# Patient Record
Sex: Male | Born: 1935 | Race: White | Hispanic: No | Marital: Married
Health system: Southern US, Community
[De-identification: ages and names within clinical notes are randomized; demographics above are authoritative.]

---

## 2006-12-18 ENCOUNTER — Emergency Department: Payer: Self-pay | Admitting: Emergency Medicine

## 2006-12-28 ENCOUNTER — Emergency Department: Payer: Self-pay | Admitting: Internal Medicine

## 2010-06-06 ENCOUNTER — Inpatient Hospital Stay: Payer: Self-pay | Admitting: Internal Medicine

## 2011-07-03 ENCOUNTER — Inpatient Hospital Stay: Payer: Self-pay | Admitting: Internal Medicine

## 2011-07-03 LAB — CBC WITH DIFFERENTIAL/PLATELET
Eosinophil #: 0.2 10*3/uL (ref 0.0–0.7)
Eosinophil %: 1.4 %
HGB: 12.9 g/dL — ABNORMAL LOW (ref 13.0–18.0)
Lymphocyte #: 1.9 10*3/uL (ref 1.0–3.6)
Lymphocyte %: 12.6 %
MCH: 31.2 pg (ref 26.0–34.0)
MCV: 94 fL (ref 80–100)
Monocyte %: 7.3 %
Neutrophil %: 78.4 %
Platelet: 233 10*3/uL (ref 150–440)
RBC: 4.13 10*6/uL — ABNORMAL LOW (ref 4.40–5.90)
WBC: 15.1 10*3/uL — ABNORMAL HIGH (ref 3.8–10.6)

## 2011-07-03 LAB — COMPREHENSIVE METABOLIC PANEL
Albumin: 3.3 g/dL — ABNORMAL LOW (ref 3.4–5.0)
Anion Gap: 13 (ref 7–16)
BUN: 39 mg/dL — ABNORMAL HIGH (ref 7–18)
Chloride: 109 mmol/L — ABNORMAL HIGH (ref 98–107)
Co2: 20 mmol/L — ABNORMAL LOW (ref 21–32)
EGFR (African American): >60
Glucose: 137 mg/dL — ABNORMAL HIGH (ref 65–99)
Osmolality: 295 (ref 275–301)
Potassium: 5.4 mmol/L — ABNORMAL HIGH (ref 3.5–5.1)
SGOT(AST): 30 U/L (ref 15–37)
SGPT (ALT): 24 U/L
Sodium: 142 mmol/L (ref 136–145)
Total Protein: 8.1 g/dL (ref 6.4–8.2)

## 2011-07-03 LAB — URINALYSIS, COMPLETE
Bilirubin,UR: NEGATIVE
Ketone: NEGATIVE
Leukocyte Esterase: NEGATIVE
Nitrite: NEGATIVE
Protein: NEGATIVE
RBC,UR: 1 /HPF (ref 0–5)
WBC UR: <1 /HPF (ref 0–5)

## 2011-07-03 LAB — CK-MB: CK-MB: 2.4 ng/mL (ref 0.5–3.6)

## 2011-07-03 LAB — TROPONIN I
Troponin-I: <0.02 ng/mL
Troponin-I: <0.02 ng/mL

## 2011-07-03 LAB — MAGNESIUM: Magnesium: 1.9 mg/dL

## 2011-07-03 LAB — PRO B NATRIURETIC PEPTIDE: B-Type Natriuretic Peptide: 2597 pg/mL — ABNORMAL HIGH (ref 0–450)

## 2011-07-03 LAB — TSH: Thyroid Stimulating Horm: 2.45 u[IU]/mL

## 2011-07-04 LAB — CK-MB: CK-MB: 1.5 ng/mL (ref 0.5–3.6)

## 2011-07-04 LAB — CBC WITH DIFFERENTIAL/PLATELET
Comment - H1-Com3: NORMAL
HGB: 10.8 g/dL — ABNORMAL LOW (ref 13.0–18.0)
MCH: 31 pg (ref 26.0–34.0)
MCV: 93 fL (ref 80–100)
Monocytes: 11 %
Platelet: 184 10*3/uL (ref 150–440)
RDW: 15.3 % — ABNORMAL HIGH (ref 11.5–14.5)
Variant Lymphocyte - H1-Rlymph: 8 %
WBC: 12 10*3/uL — ABNORMAL HIGH (ref 3.8–10.6)

## 2011-07-04 LAB — BASIC METABOLIC PANEL
Anion Gap: 11 (ref 7–16)
Calcium, Total: 8.2 mg/dL — ABNORMAL LOW (ref 8.5–10.1)
Creatinine: 0.76 mg/dL (ref 0.60–1.30)
EGFR (African American): >60
EGFR (Non-African Amer.): >60
Glucose: 122 mg/dL — ABNORMAL HIGH (ref 65–99)
Sodium: 145 mmol/L (ref 136–145)

## 2011-07-04 LAB — PROTIME-INR
INR: 1.3
Prothrombin Time: 16.4 secs — ABNORMAL HIGH (ref 11.5–14.7)

## 2011-07-05 LAB — CBC WITH DIFFERENTIAL/PLATELET
Basophil #: 0 10*3/uL (ref 0.0–0.1)
Basophil %: 0.3 %
Eosinophil #: 0.1 10*3/uL (ref 0.0–0.7)
Lymphocyte %: 13.5 %
MCHC: 33.3 g/dL (ref 32.0–36.0)
Monocyte #: 2.7 10*3/uL — ABNORMAL HIGH (ref 0.0–0.7)
Monocyte %: 17.6 %
Neutrophil #: 10.3 10*3/uL — ABNORMAL HIGH (ref 1.4–6.5)
Platelet: 173 10*3/uL (ref 150–440)
RDW: 15.5 % — ABNORMAL HIGH (ref 11.5–14.5)
WBC: 15.2 10*3/uL — ABNORMAL HIGH (ref 3.8–10.6)

## 2011-07-05 LAB — BASIC METABOLIC PANEL
BUN: 13 mg/dL (ref 7–18)
Calcium, Total: 7.7 mg/dL — ABNORMAL LOW (ref 8.5–10.1)
Chloride: 110 mmol/L — ABNORMAL HIGH (ref 98–107)
Co2: 25 mmol/L (ref 21–32)
Creatinine: 0.71 mg/dL (ref 0.60–1.30)
EGFR (African American): >60
EGFR (Non-African Amer.): >60
Glucose: 112 mg/dL — ABNORMAL HIGH (ref 65–99)
Osmolality: 290 (ref 275–301)
Potassium: 3.9 mmol/L (ref 3.5–5.1)
Sodium: 145 mmol/L (ref 136–145)

## 2011-07-06 LAB — PLATELET COUNT: Platelet: 165 10*3/uL (ref 150–440)

## 2011-07-06 LAB — BASIC METABOLIC PANEL
BUN: 11 mg/dL (ref 7–18)
Calcium, Total: 7.3 mg/dL — ABNORMAL LOW (ref 8.5–10.1)
Chloride: 109 mmol/L — ABNORMAL HIGH (ref 98–107)
Co2: 23 mmol/L (ref 21–32)
Osmolality: 282 (ref 275–301)
Potassium: 3.6 mmol/L (ref 3.5–5.1)
Sodium: 141 mmol/L (ref 136–145)

## 2011-07-06 LAB — HEMOGLOBIN: HGB: 7.2 g/dL — ABNORMAL LOW (ref 13.0–18.0)

## 2011-07-07 ENCOUNTER — Ambulatory Visit: Payer: Self-pay | Admitting: Family Medicine

## 2011-07-07 LAB — URINALYSIS, COMPLETE
Bacteria: NONE SEEN
Glucose,UR: 150 mg/dL (ref 0–75)
Nitrite: NEGATIVE
Protein: NEGATIVE
WBC UR: 2 /HPF (ref 0–5)

## 2011-07-07 LAB — HEMOGLOBIN: HGB: 8.3 g/dL — ABNORMAL LOW (ref 13.0–18.0)

## 2011-07-09 LAB — URINE CULTURE

## 2011-07-13 LAB — CULTURE, BLOOD (SINGLE)

## 2011-10-10 DEATH — deceased

## 2012-12-12 IMAGING — CT CT CERVICAL SPINE WITHOUT CONTRAST
1 series · 12 of 14 positions shown, 15 images · non-contrast
Comparison: No prior.

REASON FOR EXAM: fell hit head
COMMENTS:

PROCEDURE:     CT  - CT CERVICAL SPINE WO  - July 03, 2011  [DATE]
RESULT:
HISTORY: Fall.

[Series 5: axial · axial · 0.33mm/px · z∈[+31,+174]mm · 12 of 95 slices shown, 15 images]
[im 8/95  soft-tissue]
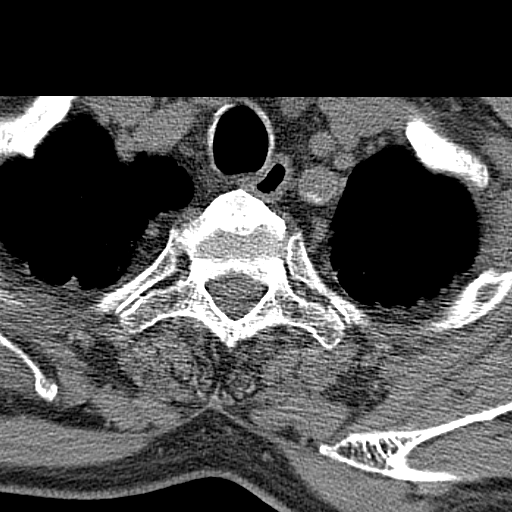
[im 8/95  bone]
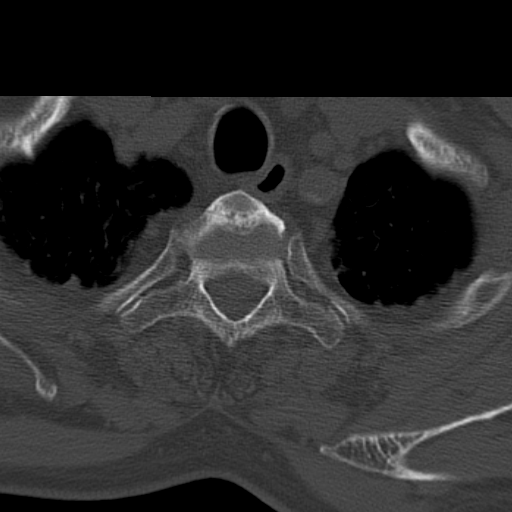
[im 15/95  bone]
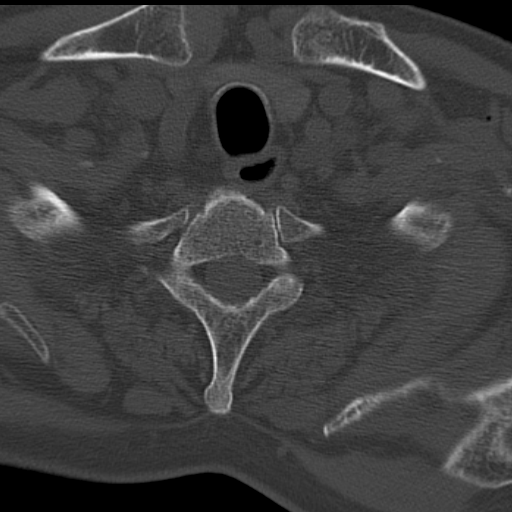
[im 22/95  bone]
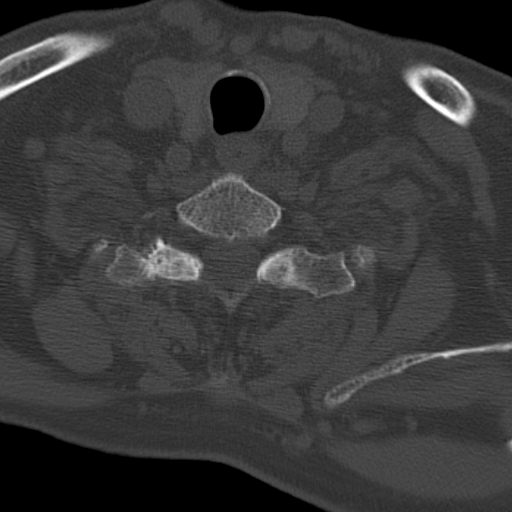
[im 29/95  bone]
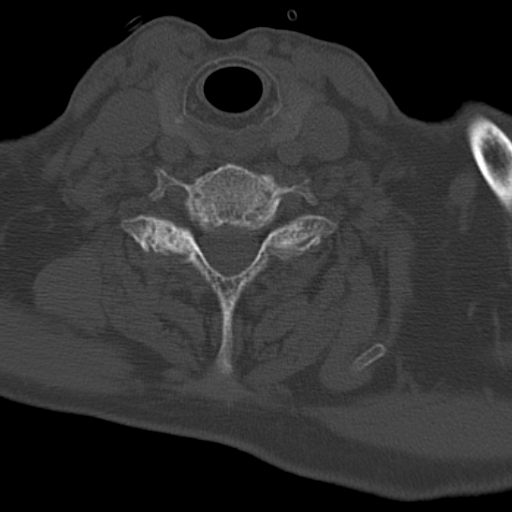
[im 37/95  soft-tissue]
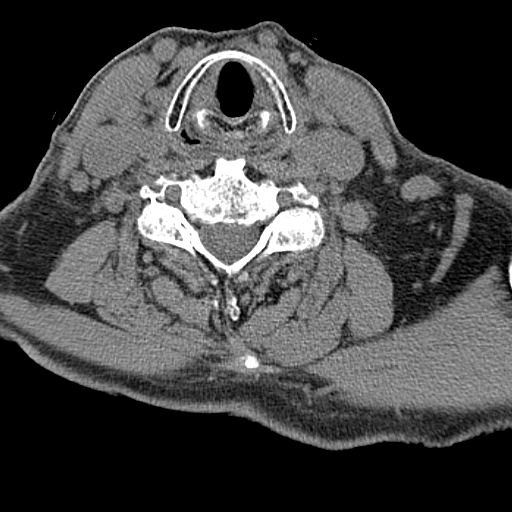
[im 37/95  bone]
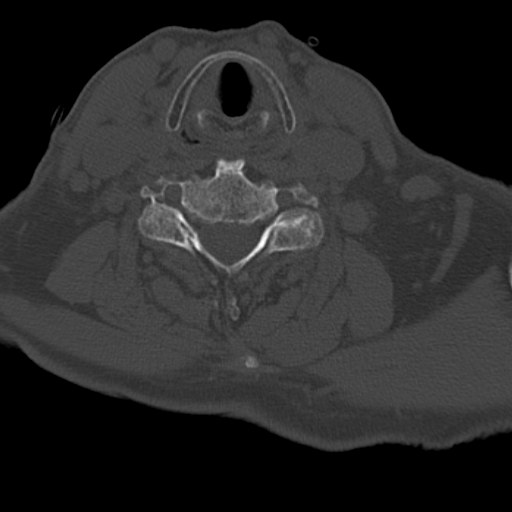
[im 44/95  bone]
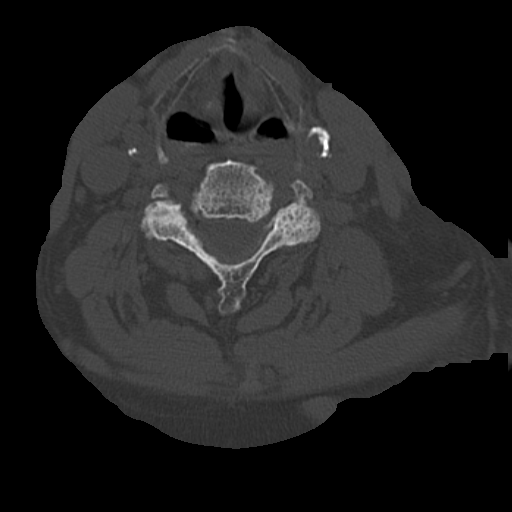
[im 51/95  bone]
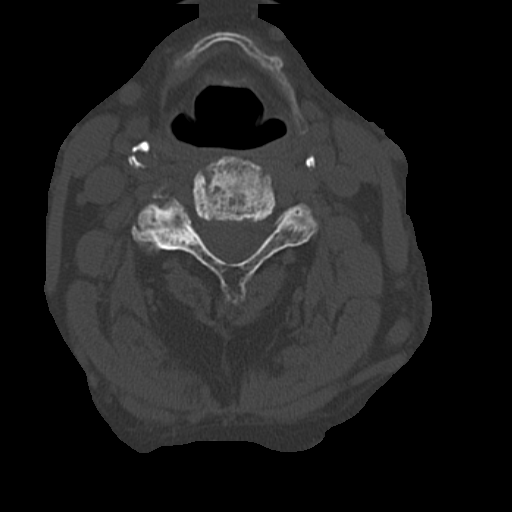
[im 58/95  bone]
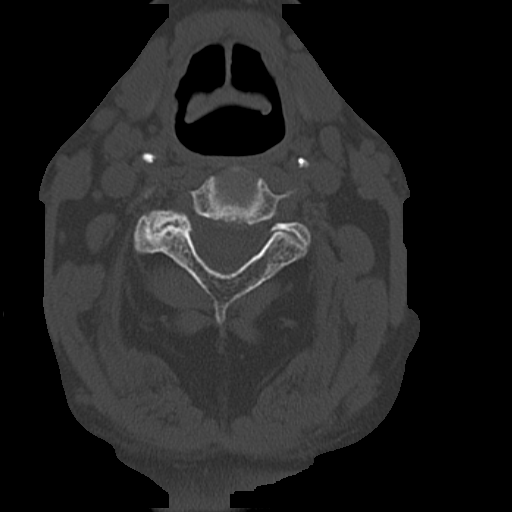
[im 66/95  soft-tissue]
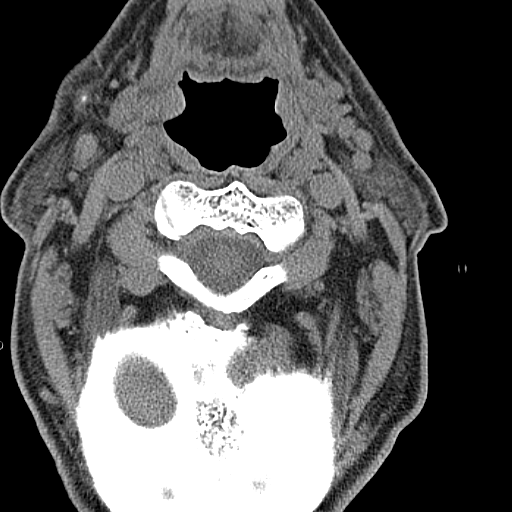
[im 66/95  bone]
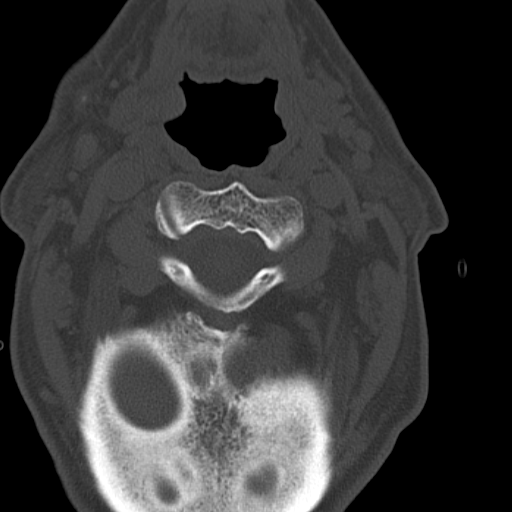
[im 73/95  bone]
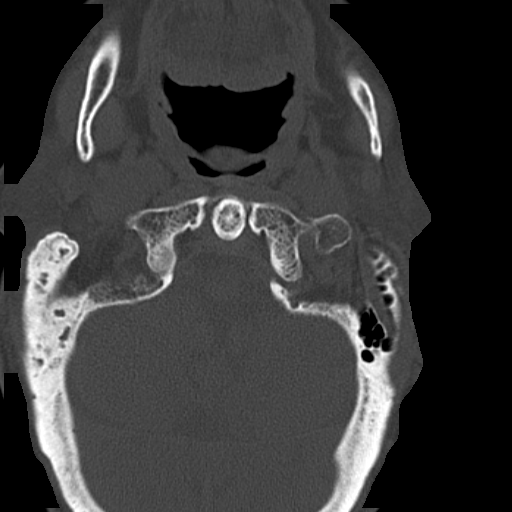
[im 80/95  bone]
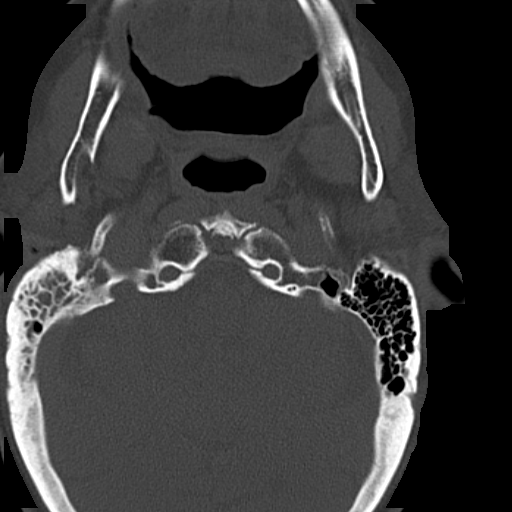
[im 87/95  bone]
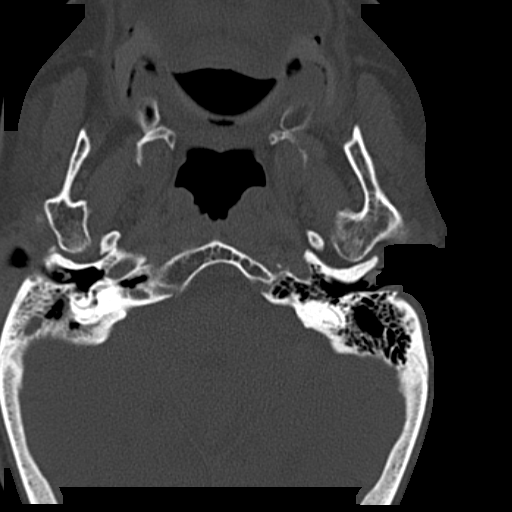

[12 of 14 positions shown; findings below may reference images not displayed]

PROCEDURE AND FINDINGS:  Standard CT of the cervical spine is obtained. No
evidence of fracture. Diffuse degenerative change is present. Congenital
fusion of C4-5 is present. Vascular calcification is present. Small
submandibular and anterior cervical lymph nodes are noted. Apical pleural
parenchymal thickening is present.
IMPRESSION: 1.  Diffuse degenerative change, no acute abnormality.
2.  Multiple submandibular and anterior cervical small lymph nodes.
3.  Pleural parenchymal scarring lung apices.
4.  Bilateral carotid prominent vascular calcification indicating
atherosclerotic vascular disease.

## 2012-12-12 IMAGING — CT CT HEAD WITHOUT CONTRAST
2 series · 15 of 30 positions shown, 19 images · non-contrast
Comparison: none

REASON FOR EXAM: fell hit head
COMMENTS:

[Series 2: without · axial · non-contrast · 0.46mm/px · z∈[+179,+324]mm · 13 of 35 slices shown, 17 images]
[im 3/35  brain]
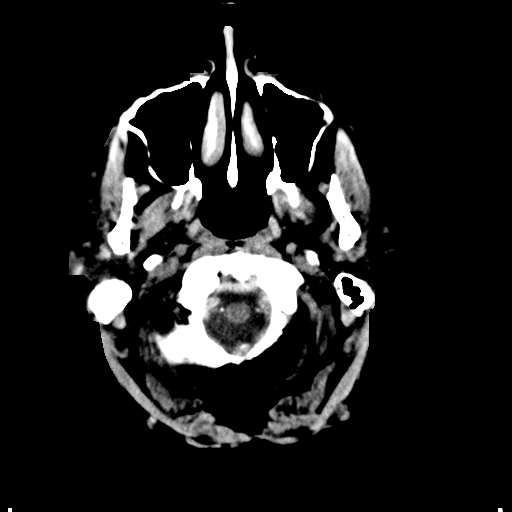
[im 3/35  bone]
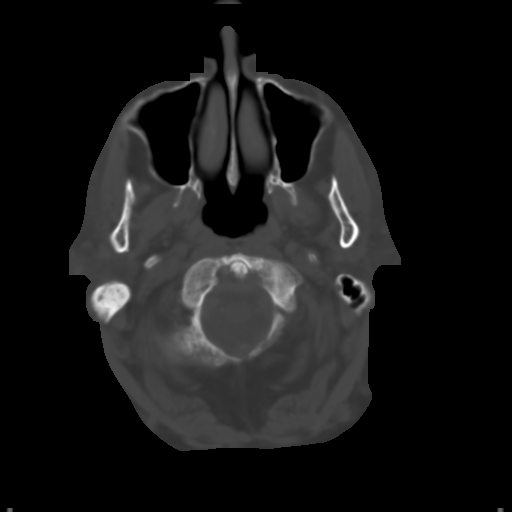
[im 5/35  brain]
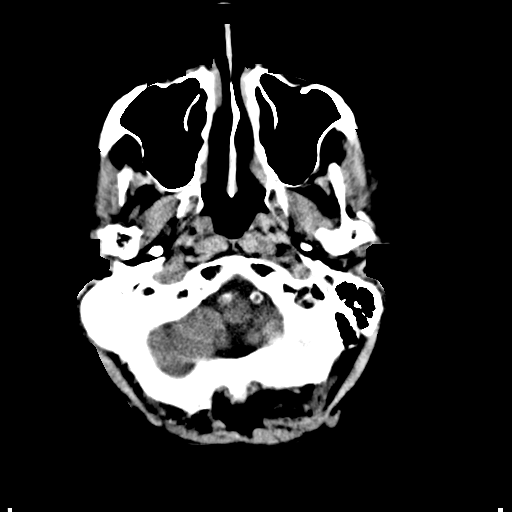
[im 8/35  brain]
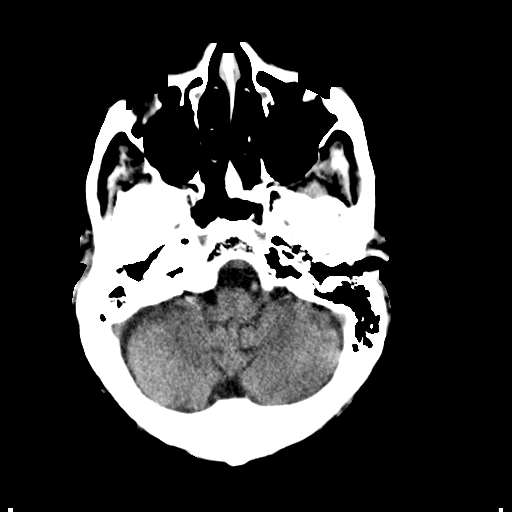
[im 10/35  brain]
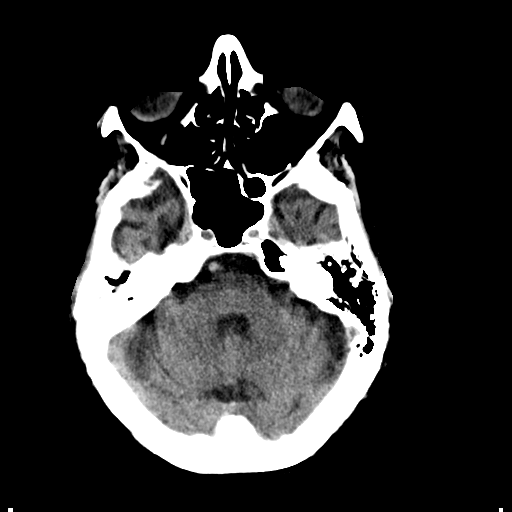
[im 13/35  brain]
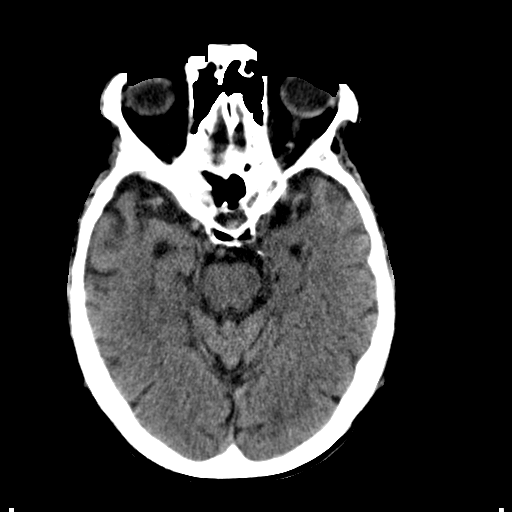
[im 13/35  bone]
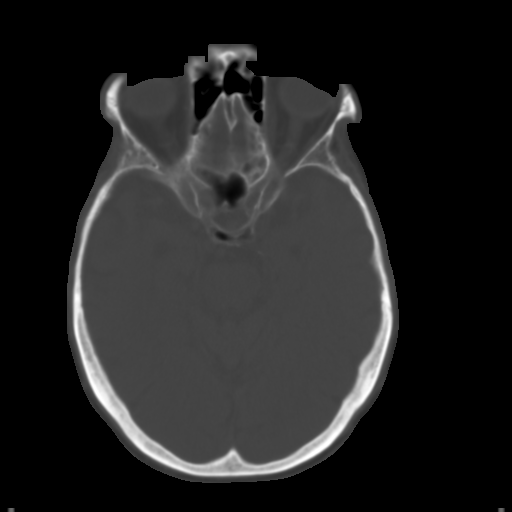
[im 15/35  brain]
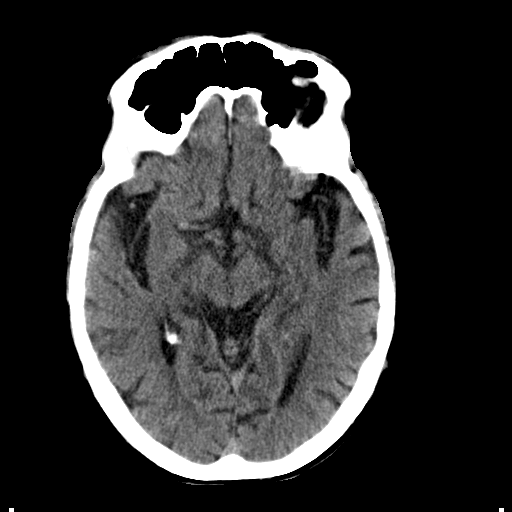
[im 18/35  brain]
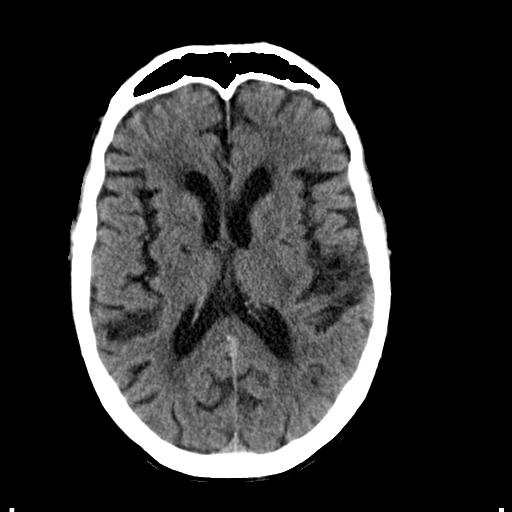
[im 20/35  brain]
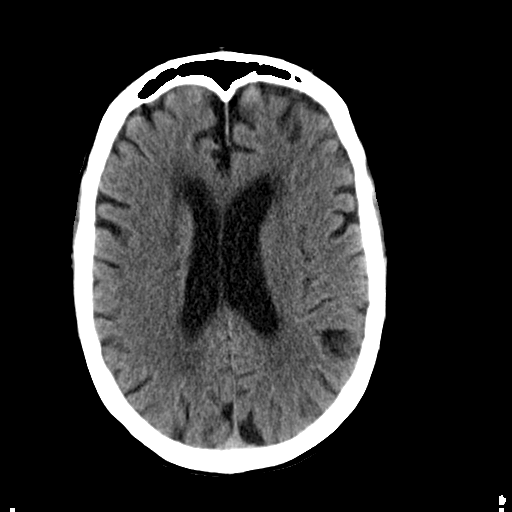
[im 22/35  brain]
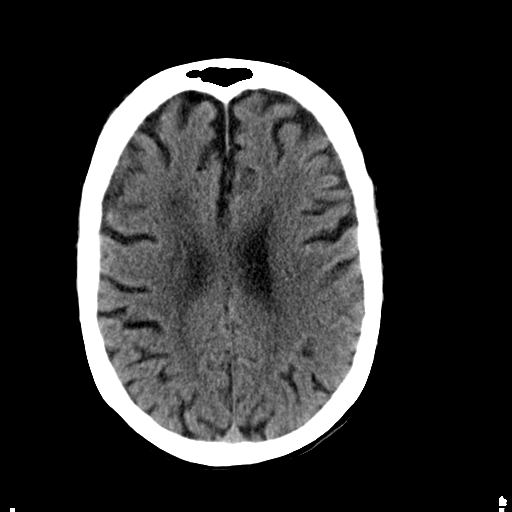
[im 22/35  bone]
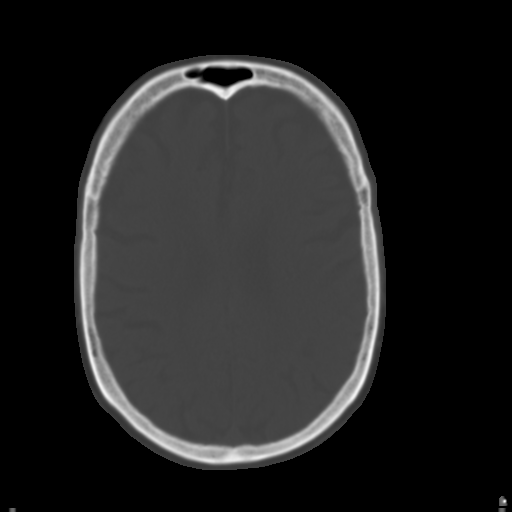
[im 25/35  brain]
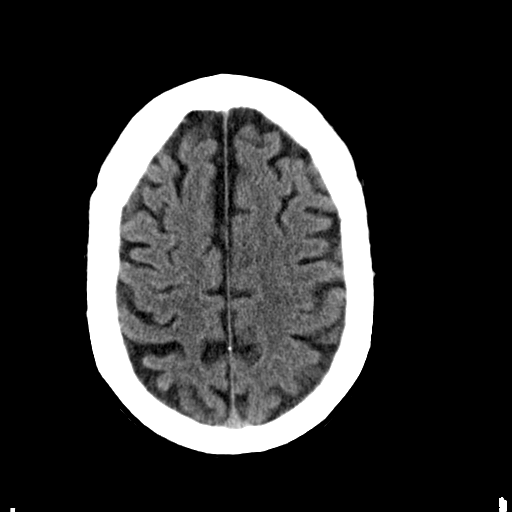
[im 27/35  brain]
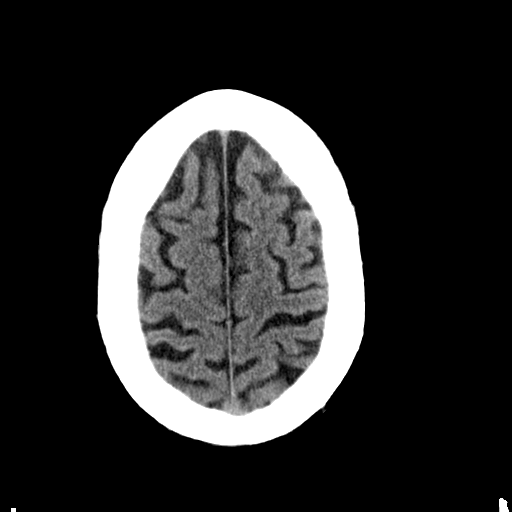
[im 30/35  brain]
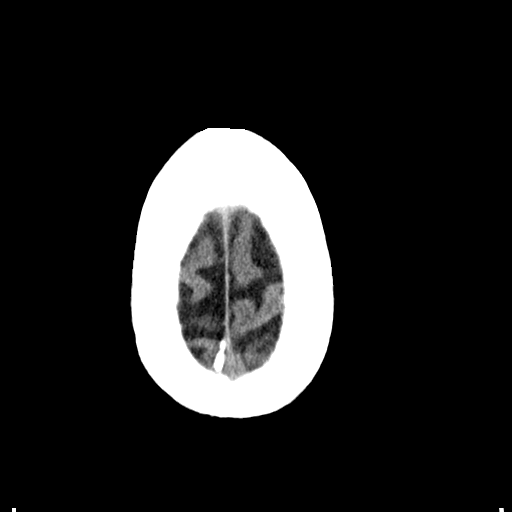
[im 32/35  brain]
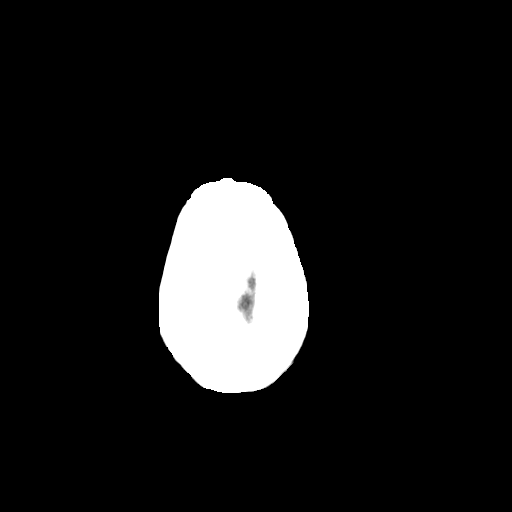
[im 32/35  bone]
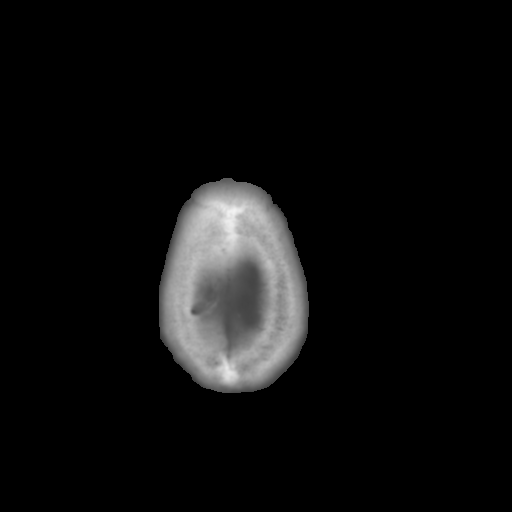

[Series 3: bone · axial · 0.46mm/px · z∈[+179,+204]mm · 2 of 35 slices shown]
[im 3/35  bone]
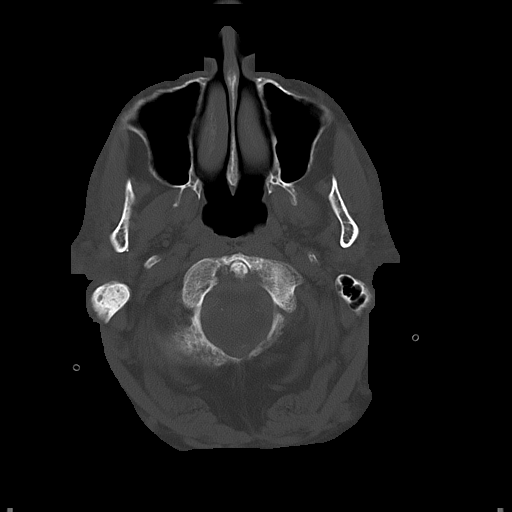
[im 8/35  bone]
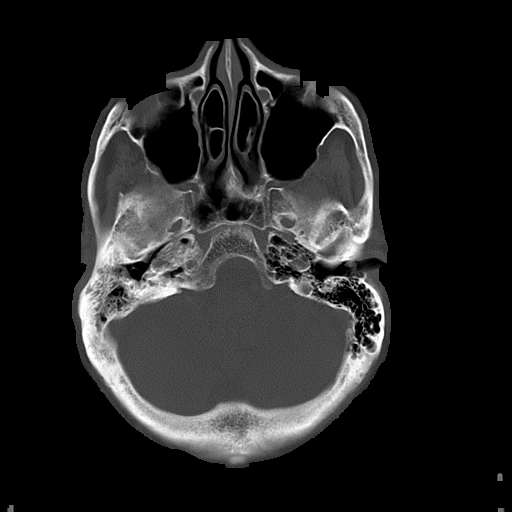

[15 of 30 positions shown; findings below may reference images not displayed]

PROCEDURE:     CT  - CT HEAD WITHOUT CONTRAST  - July 03, 2011  [DATE]

RESULT:     Axial noncontrast CT scanning was performed through the brain
with reconstructions at 5 mm intervals and slice thicknesses. Comparison is
made to the study 07 June, 2010.

There is mild age-appropriate diffuse cerebral and cerebellar atrophy with
very mild compensatory ventriculomegaly. There is decreased density in the
deep white matter of both cerebral hemispheres consistent with chronic small
vessel ischemic type change. Old subcentimeter lacunar infarctions are
demonstrated in the basal ganglia bilaterally. There is no intracranial
hemorrhage nor evidence of an acute evolving ischemic infarction. I see no
abnormal intracranial calcifications. The cerebellum and brainstem exhibit
no acute abnormality.

At bone window settings the observed portions of the paranasal sinuses and
left mastoid air cells are clear. There is chronic increased density within
the right mastoid bone. There is no evidence of an acute skull fracture.
IMPRESSION: I see no acute intracranial abnormality.

A preliminary report was sent to the [HOSPITAL] the conclusion
of the study.

## 2012-12-13 IMAGING — CR DG FEMUR 2V*R*
1 series · 4 of 4 positions shown · non-contrast
Comparison: none

REASON FOR EXAM: postop
COMMENTS:

[Series 1: ap · 0.17mm/px · 4 of 4 slices shown]
[im 1/4]
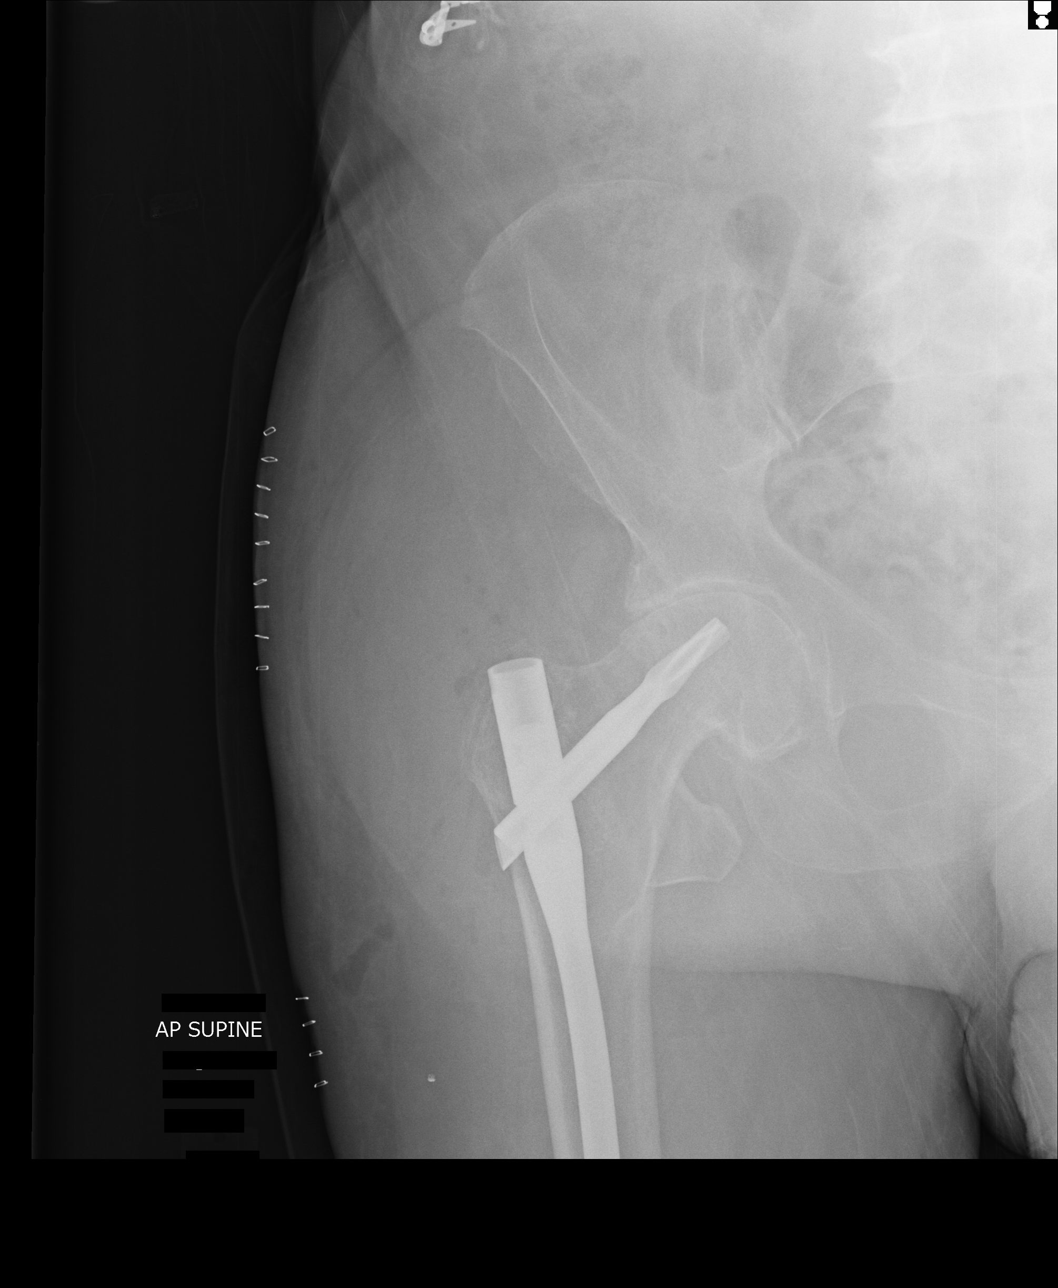
[im 2/4]
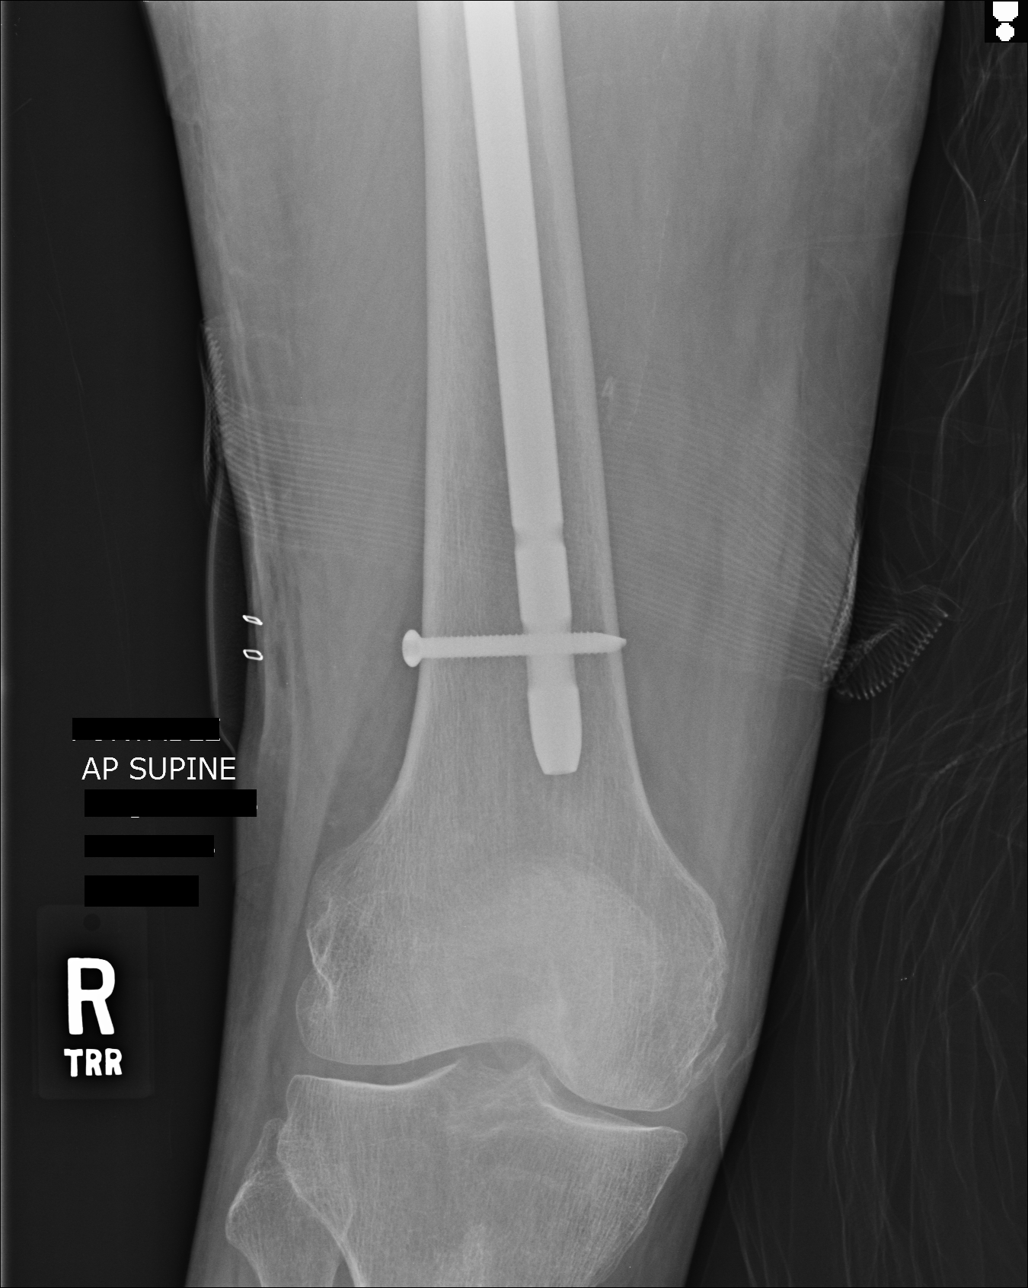
[im 3/4]
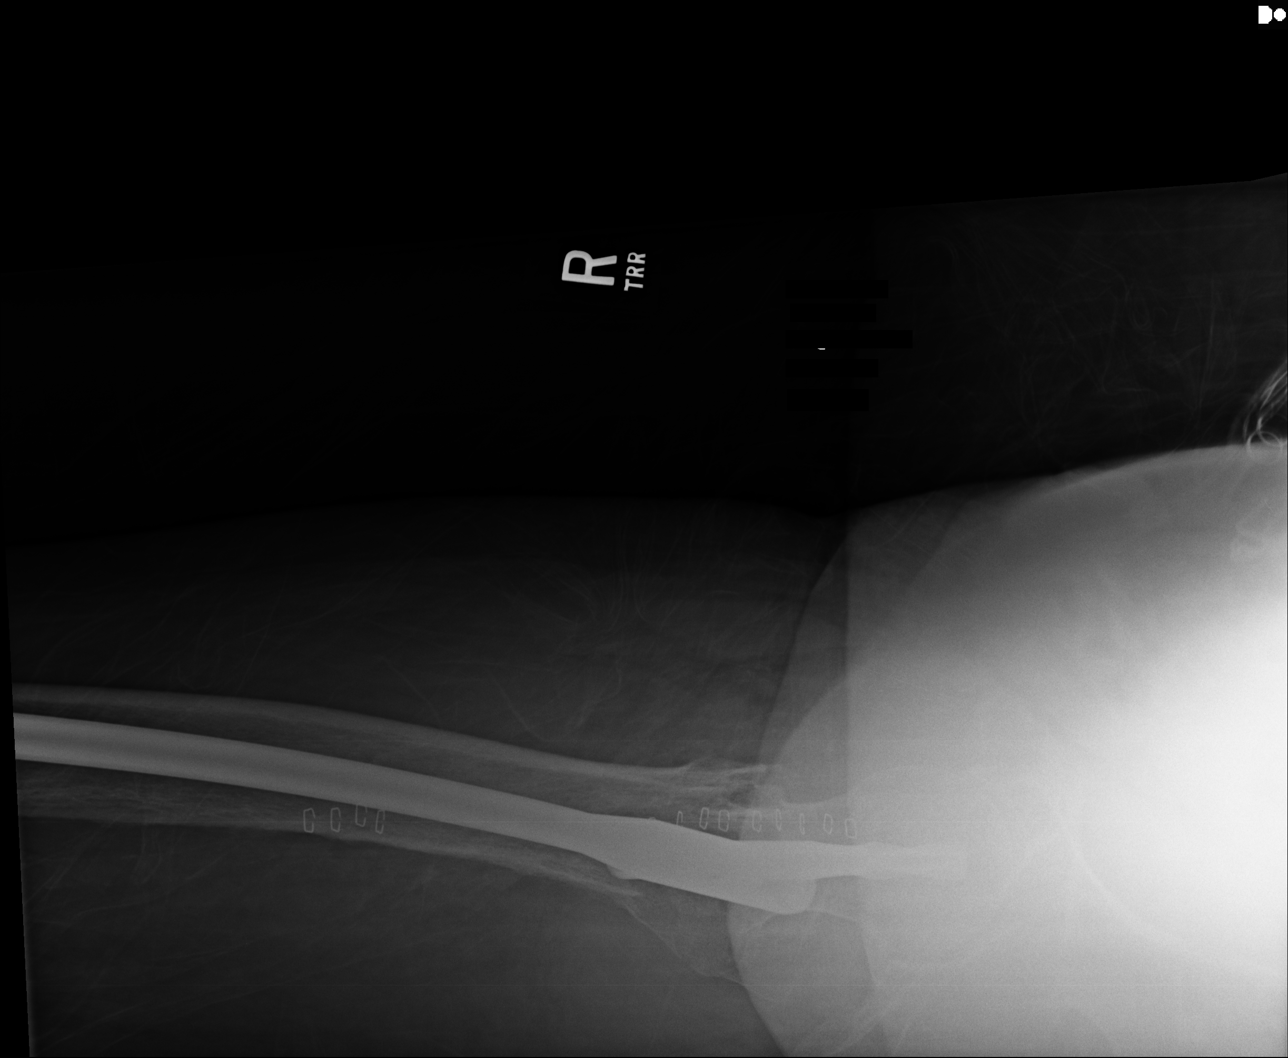
[im 4/4]
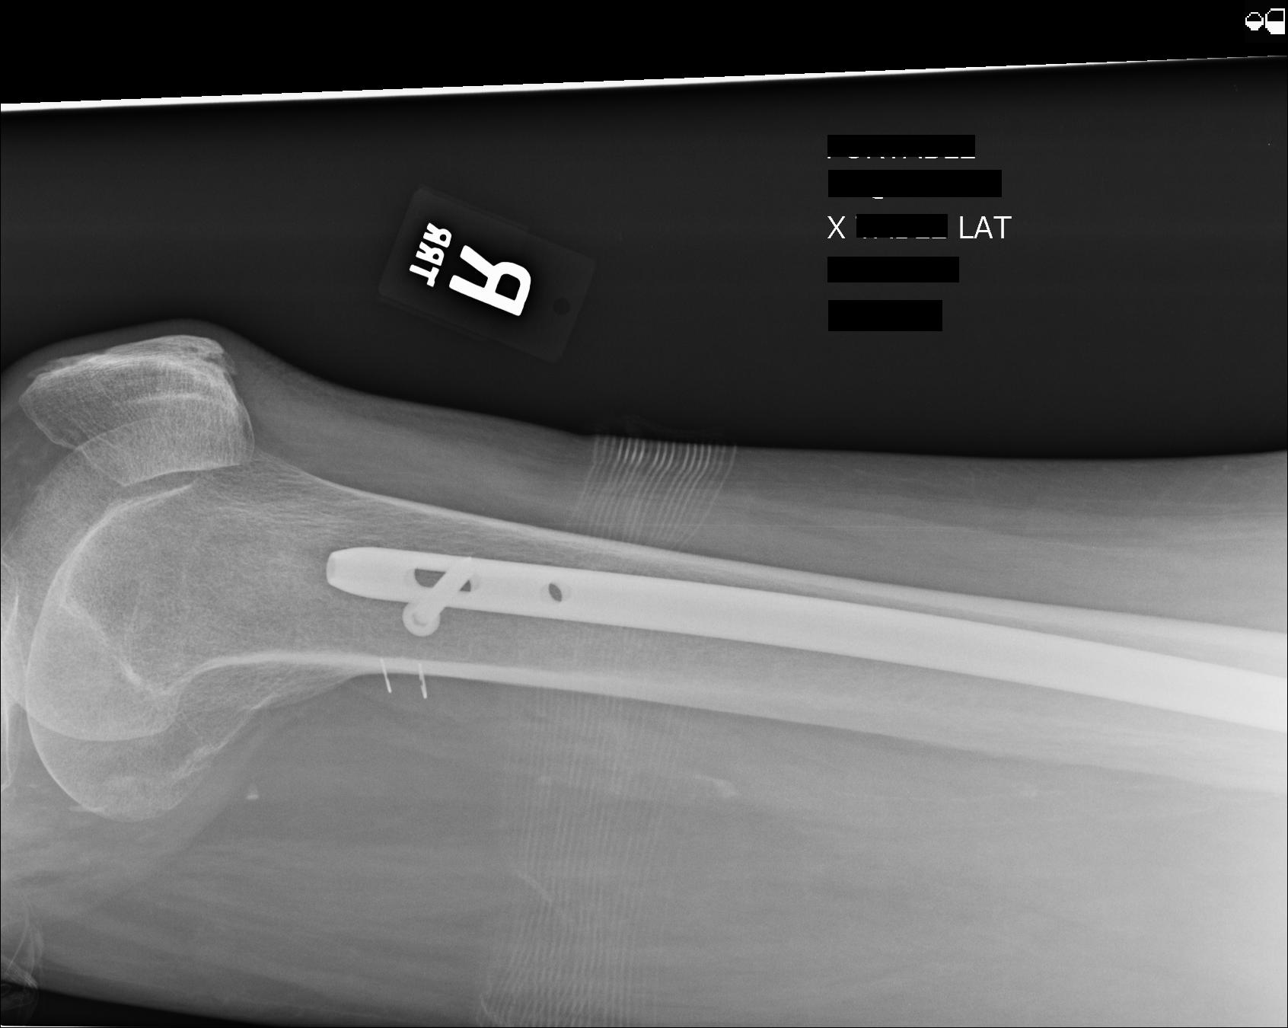

[4 of 4 positions shown; findings below may reference images not displayed]

PROCEDURE:     DXR - DXR FEMUR RIGHT  - July 04, 2011  [DATE]

RESULT:     Findings: AP and lateral views of the right femur demonstrates
interval placement of an intramedullary nail transfixing a intertrochanteric
fracture a which is in near anatomic alignment. The intramedullary nail is
transfixed with a proximal cannulated femoral neck screw and a distal
transverse fully threaded screw without hardware failure or complication.
There are moderate degenerative changes of the right hip. There are
surrounding postsurgical changes in the soft tissues. There is no
dislocation. There is no hardware failure or complication.
IMPRESSION: Please see above.
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
## 2014-10-03 NOTE — Consult Note (Signed)
Chief Complaint:   Subjective/Chief Complaint Resting in bed quietly. No pain no syncope no cp.   VITAL SIGNS/ANCILLARY NOTES: **Vital Signs.:   24-Jan-13 14:23   Vital Signs Type Routine   Temperature Temperature (F) 98.4   Celsius 36.8   Temperature Source axillary   Pulse Pulse 92   Pulse source per Dinamap   Respirations Respirations 16   Systolic BP Systolic BP 720   Diastolic BP (mmHg) Diastolic BP (mmHg) 50   Mean BP 67   BP Source Dinamap   Pulse Ox % Pulse Ox % 96   Pulse Ox Activity Level  At rest   Oxygen Delivery Room Air/ 21 %  *Intake and Output.:   Shift 24-Jan-13 15:00   Grand Totals Intake:  800 Output:      Net:  800 24 Hr.:  800   IV (Primary)      In:  800   Length of Stay Totals Intake:  2723 Output:  2225    Net:  498   Brief Assessment:   Cardiac Regular  murmur present  -- LE edema  -- JVD  --Gallop    Respiratory normal resp effort  clear BS    Gastrointestinal Normal    Gastrointestinal details normal Soft  Nontender  Nondistended  No masses palpable   Routine Hem:  24-Jan-13 05:40    WBC (CBC) 15.2   RBC (CBC) 2.76   Hemoglobin (CBC) 8.7   Hemoglobin (CBC) -   Hematocrit (CBC) 26.1   Platelet Count (CBC) 173   Platelet Count (CBC) -   MCV 95   MCH 31.4   MCHC 33.3   RDW 15.5   Neutrophil % 67.7   Lymphocyte % 13.5   Monocyte % 17.6   Eosinophil % 0.9   Basophil % 0.3   Neutrophil # 10.3   Lymphocyte # 2.1   Monocyte # 2.7   Eosinophil # 0.1   Basophil # 0.0  Routine Chem:  24-Jan-13 05:40    Glucose, Serum 112   BUN 13   Creatinine (comp) 0.71   Sodium, Serum 145   Potassium, Serum 3.9   Chloride, Serum 110   CO2, Serum 25   Calcium (Total), Serum 7.7   Osmolality (calc) 290   eGFR (African American) >60   eGFR (Non-African American) >60   Anion Gap 10   Radiology Results: XRay:    22-Jan-13 05:13, Chest Portable Single View   Chest Portable Single View    REASON FOR EXAM:    fell, ht rate 33  COMMENTS:        PROCEDURE: DXR - DXR PORTABLE CHEST SINGLE VIEW  - Jul 03 2011  5:13AM     RESULT: Comparison is made to the study of 06 June 2010.    The lungs are adequately inflated. There is new blunting of the left   lateral costophrenic angle. The pulmonary interstitial markings are   increased bilaterally. The pulmonary vascularity is engorged. The cardiac   silhouette has increased in size. The mediastinum is normal in width.   There are degenerative changes of the left shoulder. I see no acute   displaced rib fracture. There is evidence of previous granulomatous   infection of the lungs.    IMPRESSION:  The findings are consistent with CHF superimposed upon COPD.   I do not see evidence of a pneumothorax nor pneumomediastinum. If there   remain strong clinical concerns of occult occult thoracic posttraumatic   injury, further  evaluation with CT scanning is available upon request.          Verified By: DAVID A. Martinique, M.D., MD    22-Jan-13 05:13, Hip Right Complete   Hip Right Complete    REASON FOR EXAM:    fell pain  COMMENTS:       PROCEDURE: DXR - DXR HIP RIGHT COMPLETE  - Jul 03 2011  5:13AM     RESULT: AP and crosstable lateral views of the right hip reveal the   patient to have sustained an intertrochanteric fracture. There is   angulation at the fracture site. There is a avulsion of the lesser   trochanter. There is mild asymmetric narrowing of the hip joint space   superiorly.    IMPRESSION:  The patient has sustained an acute intertrochanteric   fracture of the right hip.        Verified By: DAVID A. Martinique, M.D., MD    22-Jan-13 05:13, Pelvis AP Only   Pelvis AP Only    REASON FOR EXAM:    fell hip pain  COMMENTS:       PROCEDURE: DXR - DXR PELVIS AP ONLY  - Jul 03 2011  5:13AM     RESULT: The bony pelvis is osteopenic. There is an acute   intertrochanteric fracture of the right hip. There is previous ORIF for   an intertrochanteric fracture of the left  hip. I see no acute fracture of   the bony pelvis. There is mild degenerative change of the right hip joint.    IMPRESSION:  I do not see evidence of an acute fracture of the bony   pelvis in this patient with anacute right intertrochanteric fracture.          Verified By: DAVID A. [place], M.D., MD    23-Jan-13 21:27, Femur Right   Femur Right    REASON FOR EXAM:    postop  COMMENTS:       PROCEDURE: DXR - DXR FEMUR RIGHT  - Jul 04 2011  9:27PM     RESULT: Findings: AP and lateral views of the right femur demonstrates   interval placement of an intramedullary nail transfixing a   intertrochanteric fracture a which is in near anatomic alignment. The   intramedullary nail is transfixed with a proximal cannulated femoral neck   screw and a distal transverse fully threaded screw without hardware   failure or complication. There are moderate degenerative changes of the   right hip. There are surrounding postsurgical changes in the soft   tissues. There is no dislocation. There is no hardware failure or   complication.    IMPRESSION:     Please see above.          Verified By: Jennette Banker, M.D., MD  Cardiology:    22-Jan-13 03:16, ED ECG   Ventricular Rate 33   Atrial Rate 234   QRS Duration 106   QT 592   QTc 438   R Axis -53   T Axis 46   ECG interpretation    Junctional bradycardia  Left axis deviation  Inferior infarct , age undetermined  Abnormal ECG  When compared with ECG of 07-Jun-2010 02:43,  Junctional rhythm has replaced Sinus rhythm  Vent. rate has decreased BY  46 BPM  QT has shortened  ----------unconfirmed----------  Confirmed by OVERREAD, NOT (100), editor PEARSON, BARBARA (1) on 07/03/2011 10:44:06 AM   ED ECG     22-Jan-13 06:41, ECG  Ventricular Rate 66   Atrial Rate 60   P-R Interval 272   QRS Duration 102   QT 482   QTc 505   P Axis -48   R Axis -51   T Axis 35   ECG interpretation    Accelerated Junctional rhythm with occasional  Premature ventricular complexes  Left axis deviation  Septal infarct , age undetermined  Prolonged QT  Abnormal ECG  When compared with ECG of 03-Jul-2011 03:16,  Premature ventricular complexes are now Present  Vent. rate has increased BY  33 BPM  T wave inversion now evident in Inferior leads  QT has lengthened  ----------unconfirmed----------  Confirmed by OVERREAD, NOT (100), editor PEARSON, BARBARA ([id number]) on 07/04/2011 9:38:08 AM   ECG   CT:    22-Jan-13 03:46, CT Head Without Contrast   CT Head Without Contrast    REASON FOR EXAM:    fell hit head  COMMENTS:       PROCEDURE: CT  - CT HEAD WITHOUT CONTRAST  - Jul 03 2011  3:46AM     RESULT: Axial noncontrast CT scanning was performed through the brain   with reconstructions at 5 mm intervals and slice thicknesses. Comparison   is made to the study of 07 June 2010.    There is mild age-appropriate diffuse cerebral and cerebellar atrophy   with very mild compensatory ventriculomegaly. There is decreased density   in the deep white matter of both cerebral hemispheres consistent with   chronic small vessel ischemic type change. Old subcentimeter lacunar   infarctions are demonstrated in the basal ganglia bilaterally. There is   no intracranial hemorrhage nor evidence of an acute evolving ischemic     infarction. I see no abnormal intracranial calcifications. The cerebellum   and brainstem exhibit no acute abnormality.    At bone window settings the observed portions of the paranasal sinuses   and left mastoid air cells are clear. There is chronic increased density   within the right mastoid bone. There is no evidence of an acute skull   fracture.    IMPRESSION:  I see no acute intracranial abnormality.    A preliminary report was sent to the emergency department at the   conclusion of the study.          Verified By: DAVID A. Martinique, M.D., MD    22-Jan-13 04:02, CT Cervical Spine Without Contrast   CT Cervical  Spine Without Contrast    REASON FOR EXAM:    fell hit head  COMMENTS:       PROCEDURE: CT  - CT CERVICAL SPINE WO  - Jul 03 2011  4:02AM     RESULT:     HISTORY: Fall.    COMPARISON: No prior.    PROCEDURE AND FINDINGS:  Standard CT of the cervical spine is obtained.   No evidence of fracture. Diffuse degenerative change is present.   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 Vascular calcification is present.   Small submandibular and anterior cervical lymph nodes are noted. Apical     pleural parenchymal thickening is present.     IMPRESSION:      1.  Diffuse degenerative change, no acute abnormality.  2.  Multiple submandibular and anterior cervical small lymph nodes.  3.  Pleural parenchymal scarring lung apices.  4.  Bilateral carotid prominent vascular calcification indicating   atherosclerotic vascular disease.      Thank you for this opportunity to contribute to the care of  your patient.           Verified By: Osa Craver, M.D., MD   Assessment/Plan:  Invasive Device Daily Assessment of Necessity:   Does the patient currently have any of the following indwelling devices? none   Assessment/Plan:   Assessment IMP Bradycardia SSS AFIB HTN AMS Hip Fx    Plan PLAN Tele S/P hip surgery Continue to hold sotolol consider low dose B-blocker for rate Not a good coumadin candiate because of falls Rec [facility] i do not rec further cardiac w/u at this point   Electronic Signatures: Lujean Amel D (MD)  (Signed 24-Jan-13 21:58)  Authored: Chief Complaint, VITAL SIGNS/ANCILLARY NOTES, Brief Assessment, Lab Results, Radiology Results, Assessment/Plan   Last Updated: 24-Jan-13 21:58 by Lujean Amel D (MD)
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
## 2014-10-03 NOTE — Consult Note (Signed)
Chief Complaint:   Subjective/Chief Complaint Confused AMS lying in bed. no new c/o. Pre-op for surgery.   VITAL SIGNS/ANCILLARY NOTES: **Vital Signs.:   23-Jan-13 09:16   Vital Signs Type Routine   Temperature Temperature (F) 98.2   Celsius 36.7   Temperature Source oral   Pulse Pulse 73   Pulse source per Dinamap   Respirations Respirations 20   Systolic BP Systolic BP 177   Diastolic BP (mmHg) Diastolic BP (mmHg) 62   Mean BP 82   BP Source Dinamap   Pulse Ox % Pulse Ox % 92   Pulse Ox Activity Level  At rest   Oxygen Delivery 2L  *Intake and Output.:   Daily 23-Jan-13 07:00   Grand Totals Intake:  923 Output:  1050    Net:  -127 24 Hr.:  -127   Oral Intake      In:  240   IV (Primary)      In:  683   Urine ml     Out:  1050   Length of Stay Totals Intake:  923 Output:  1050    Net:  -127   Brief Assessment:   Cardiac Regular  murmur present  -- LE edema  -- JVD  --Gallop    Respiratory normal resp effort  clear BS    Gastrointestinal Normal    Gastrointestinal details normal Soft  Nontender  Nondistended  No masses palpable   Routine Hem:  23-Jan-13 05:27    WBC (CBC) 12.0   RBC (CBC) 3.47   Hemoglobin (CBC) 10.8   Hematocrit (CBC) 32.2   Platelet Count (CBC) 184   MCV 93   MCH 31.0   MCHC 33.3   RDW 15.3  Routine Chem:  23-Jan-13 05:27    Glucose, Serum 122   BUN 20   Creatinine (comp) 0.76   Sodium, Serum 145   Potassium, Serum 4.3   Chloride, Serum 109   CO2, Serum 25   Calcium (Total), Serum 8.2   Osmolality (calc) 293   eGFR (African American) >60   eGFR (Non-African American) >60   Anion Gap 11  Cardiac:  23-Jan-13 05:27    CK, Total 114   CPK-MB, Serum 1.5  Routine Coag:  23-Jan-13 05:27    Prothrombin 16.4   INR 1.3  Routine Hem:  23-Jan-13 05:27    Segmented Neutrophils 70   Lymphocytes 11   Variant Lymphocytes 8   Monocytes 11   Radiology Results: XRay:    22-Jan-13 05:13, Chest Portable Single View   Chest Portable  Single View    REASON FOR EXAM:    fell, ht rate 33  COMMENTS:       PROCEDURE: DXR - DXR PORTABLE CHEST SINGLE VIEW  - Jul 03 2011  5:13AM     RESULT: Comparison is made to the study of 06 June 2010.    The lungs are adequately inflated. There is new blunting of the left   lateral costophrenic angle. The pulmonary interstitial markings are   increased bilaterally. The pulmonary vascularity is engorged. The cardiac   silhouette has increased in size. The mediastinum is normal in width.   There are degenerative changes of the left shoulder. I see no acute   displaced rib fracture. There is evidence of previous granulomatous   infection of the lungs.    IMPRESSION:  The findings are consistent with CHF superimposed upon COPD.   I do not see evidence of a pneumothorax nor pneumomediastinum. If  there   remain strong clinical concerns of occult occult thoracic posttraumatic   injury, further evaluation with CT scanning is available upon request.          Verified By: DAVID A. Martinique, M.D., MD    22-Jan-13 05:13, Hip Right Complete   Hip Right Complete    REASON FOR EXAM:    fell pain  COMMENTS:       PROCEDURE: DXR - DXR HIP RIGHT COMPLETE  - Jul 03 2011  5:13AM     RESULT: AP and crosstable lateral views of the right hip reveal the   patient to have sustained an intertrochanteric fracture. There is   angulation at the fracture site. There is a avulsion of the lesser   trochanter. There is mild asymmetric narrowing of the hip joint space   superiorly.    IMPRESSION:  The patient has sustained an acute intertrochanteric   fracture of the right hip.        Verified By: DAVID A. Martinique, M.D., MD    22-Jan-13 05:13, Pelvis AP Only   Pelvis AP Only    REASON FOR EXAM:    fell hip pain  COMMENTS:       PROCEDURE: DXR - DXR PELVIS AP ONLY  - Jul 03 2011  5:13AM     RESULT: The bony pelvis is osteopenic. There is an acute   intertrochanteric fracture of the right hip. There  is previous ORIF for   an intertrochanteric fracture of the left hip. I see no acute fracture of   the bony pelvis. There is mild degenerative change of the right hip joint.    IMPRESSION:  I do not see evidence of an acute fracture of the bony   pelvis in this patient with anacute right intertrochanteric fracture.          Verified By: DAVID A. Martinique, M.D., MD    23-Jan-13 21:27, Femur Right   Femur Right    REASON FOR EXAM:    postop  COMMENTS:       PROCEDURE: DXR - DXR FEMUR RIGHT  - Jul 04 2011  9:27PM     RESULT: Findings: AP and lateral views of the right femur demonstrates   interval placement of an intramedullary nail transfixing a   intertrochanteric fracture a which is in near anatomic alignment. The   intramedullary nail is transfixed with a proximal cannulated femoral neck   screw and a distal transverse fully threaded screw without hardware   failure or complication. There are moderate degenerative changes of the   right hip. There are surrounding postsurgical changes in the soft   tissues. There is no dislocation. There is no hardware failure or   complication.    IMPRESSION:     Please see above.          Verified By: Jennette Banker, M.D., MD  Cardiology:    22-Jan-13 03:16, ED ECG   Ventricular Rate 33   Atrial Rate 234   QRS Duration 106   QT 592   QTc 438   R Axis -61   T Axis 46   ECG interpretation    Junctional bradycardia  Left axis deviation  Inferior infarct , age undetermined  Abnormal ECG  When compared with ECG of 07-Jun-2010 02:43,  Junctional rhythm has replaced Sinus rhythm  Vent. rate has decreased BY  46 BPM  QT has shortened  ----------unconfirmed----------  Confirmed by OVERREAD, NOT ([id number]), editor PEARSON, BARBARA ([id number]) on  07/03/2011 10:44:06 AM   ED ECG     22-Jan-13 06:41, ECG   Ventricular Rate 66   Atrial Rate 60   P-R Interval 272   QRS Duration 102   QT 482   QTc 505   P Axis -48   R Axis -51   T Axis 35   ECG  interpretation    Accelerated Junctional rhythm with occasional Premature ventricular complexes  Left axis deviation  Septal infarct , age undetermined  Prolonged QT  Abnormal ECG  When compared with ECG of 03-Jul-2011 03:16,  Premature ventricular complexes are now Present  Vent. rate has increased BY  33 BPM  T wave inversion now evident in Inferior leads  QT has lengthened  ----------unconfirmed----------  Confirmed by OVERREAD, NOT ([id number]), editor PEARSON, BARBARA ([id number]) on 07/04/2011 9:38:08 AM   ECG   CT:    22-Jan-13 03:46, CT Head Without Contrast   CT Head Without Contrast    REASON FOR EXAM:    fell hit head  COMMENTS:       PROCEDURE: CT  - CT HEAD WITHOUT CONTRAST  - Jul 03 2011  3:46AM     RESULT: Axial noncontrast CT scanning was performed through the brain   with reconstructions at 5 mm intervals and slice thicknesses. Comparison   is made to the study of 07 June 2010.    There is mild age-appropriate diffuse cerebral and cerebellar atrophy   with very mild compensatory ventriculomegaly. There is decreased density   in the deep white matter of both cerebral hemispheres consistent with   chronic small vessel ischemic type change. Old subcentimeter lacunar   infarctions are demonstrated in the basal ganglia bilaterally. There is   no intracranial hemorrhage nor evidence of an acute evolving ischemic     infarction. I see no abnormal intracranial calcifications. The cerebellum   and brainstem exhibit no acute abnormality.    At bone window settings the observed portions of the paranasal sinuses   and left mastoid air cells are clear. There is chronic increased density   within the right mastoid bone. There is no evidence of an acute skull   fracture.    IMPRESSION:  I see no acute intracranial abnormality.    A preliminary report was sent to the emergency department at the   conclusion of the study.          Verified By: DAVID A. Martinique, M.D., MD     22-Jan-13 04:02, CT Cervical Spine Without Contrast   CT Cervical Spine Without Contrast    REASON FOR EXAM:    fell hit head  COMMENTS:       PROCEDURE: CT  - CT CERVICAL SPINE WO  - Jul 03 2011  4:02AM     RESULT:     HISTORY: Fall.    COMPARISON: No prior.    PROCEDURE AND FINDINGS:  Standard CT of the cervical spine is obtained.   No evidence of fracture. Diffuse degenerative change is present.   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 Vascular calcification is present.   Small submandibular and anterior cervical lymph nodes are noted. Apical     pleural parenchymal thickening is present.     IMPRESSION:      1.  Diffuse degenerative change, no acute abnormality.  2.  Multiple submandibular and anterior cervical small lymph nodes.  3.  Pleural parenchymal scarring lung apices.  4.  Bilateral carotid prominent vascular calcification indicating   atherosclerotic vascular disease.  Thank you for this opportunity to contribute to the care of your patient.           Verified By: Osa Craver, M.D., MD   Assessment/Plan:  Invasive Device Daily Assessment of Necessity:   Does the patient currently have any of the following indwelling devices? none   Assessment/Plan:   Assessment IMP Pre-op hip surgery Confusion Bradycardia SSS AFIB HTN AMS Hip Fx    Plan PLAN Continue medical therapy Tele Pre-op hip surgery Continue to hold sotolol consider low dose B-blocker for rate Not a good coumadin candiate because of falls Rec rehab at [facility] i do not rec further cardiac w/u at this point   Electronic Signatures: Lujean Amel D (MD)  (Signed 24-Jan-13 22:02)  Authored: Chief Complaint, VITAL SIGNS/ANCILLARY NOTES, Brief Assessment, Lab Results, Radiology Results, Assessment/Plan   Last Updated: 24-Jan-13 22:02 by Lujean Amel D (MD)

## 2014-10-03 NOTE — Discharge Summary (Signed)
PATIENT NAME:  Carl Richards, Carl E MR#:  [id number] DATE OF BIRTH:  [birth year]  DATE OF ADMISSION:  07/03/2011 DATE OF DISCHARGE:  07/07/2011  DISCHARGE DIAGNOSES:  1. Right hip fracture, status post mechanical fall, status post open reduction internal fixation on 07/04/2011.  2. Questionable history of congestive heart failure, unclear whether it is systolic or diastolic, clinically compensated.  3. Leukocytosis.  4. Seizure.  5. History of anemia postoperatively plus hemodilutional, status post 1 unit of blood.  6. Altered mental status, possibly due to dementia and pain medications.  7. History of atrial fibrillation/bradycardia, currently in sinus rhythm.   CONSULTATIONS:  1. Orthopedic consultation with Dr. Ernest PineHooten.  2. Psychiatry consultation with Dr. Jeanie SewerWilliford.  3. Cardiology consultation with Dr. Juliann Paresallwood.   DISPOSITION: The patient is being discharged to [facility] facility.   DIET: Low sodium, mechanical soft.   ACTIVITY: As tolerated. Continue PT.    DISCHARGE MEDICATIONS:  1. Phenobarbital 95.2 mg daily.  2. Dilantin ER 100 mg t.i.d.  3. Tylenol 650 mg every 6 hours p.r.n. 4. Dulcolax 10 mg daily p.r.n. for constipation. 5. Oxycodone 5 to 10 mg p.o. every 4 hours p.r.n. severe pain. 6. Senokot 1 tablet p.o. b.i.d., hold for loose stools.  7. Protonix 20 mg b.i.d.  8. Lovenox 30 mg subcutaneously b.i.d., discontinue after 14 days. 9. Lopressor 12.5 mg b.i.d.  10. Aspirin 325 mg daily.  11. Zofran ODT 4 mg every 6 hours p.r.n. nausea and vomiting.   PROCEDURES: Right hip open reduction internal fixation.  LABORATORY, DIAGNOSTIC AND RADIOLOGICAL DATA:  CAT scan of the cervical spine showed degenerative joint disease. No other significant abnormalities.  CAT scan of the head showed no acute intracranial abnormalities.  Right femur showed fracture.  Urine culture showed no growth in 18 to 24 hours. Hemoglobin 8.3 by the time of discharge after 1 unit of  blood. Essentially normal complete metabolic panel.  Cardiac enzymes negative.  TSH 2.45.  Therapeutic Dilantin and phenobarbital levels.   HOSPITAL COURSE: The patient is an 81 year old male with past medical history of atrial fibrillation, on Sotalol, seizure disorder, iron deficiency anemia, gastroesophageal reflux disease, osteoarthritis, underlying dementia, left hip fracture status post repair, presented with a fall resulting in a right hip fracture. The patient was initially admitted to the Orthopedic Service; however, he was transferred to the Medical Service after the patient developed bradycardia/junctional rhythm. The patient underwent open reduction internal fixation of his right hip by Dr. Ernest PineHooten on 07/04/2011. He has been cleared for discharge by the Orthopedic Service.   The patient was found to have some bradycardia/junctional rhythm. A Cardiology consultation with Dr. Juliann Paresallwood was obtained, who recommended that the patient's Sotalol be discontinued. At the time of discharge, a low-dose beta blocker has been started as per his recommendations. The patient's cardiac enzymes have been negative. The patient has a history of atrial fibrillation, but currently he is in sinus rhythm.   The patient had some altered mental status initially which was felt to be due to underlying vascular dementia in addition to confusion due to pain medications. He was evaluated by Psychiatry, who had no other additional recommendations. CAT scan of the head showed atrophy. His TSH was normal.   The patient did develop some postoperative anemia which was also compounded by hemodilution. He received 1 unit of blood, and his hemoglobin is up to 8.3. He is hemodynamically stable.   The patient had a history of seizure. He is on Dilantin and phenobarbital. His  levels are therapeutic.   CONDITION ON DISCHARGE: Overall, the patient's condition is stable, and he is being discharged to a rehab facility in a stable  condition.   TIME SPENT: 45 minutes.   ____________________________ Darrick Meigs, MD sp:cbb D: 07/07/2011 11:11:23 ET T: 07/07/2011 11:23:40 ET JOB#: [id number]  cc: Darrick Meigs, MD, <Dictator> Darrick Meigs MD ELECTRONICALLY SIGNED 07/09/2011 15:10

## 2014-10-03 NOTE — Op Note (Signed)
PATIENT NAME:  Carl Richards, Carl Richards MR#:  [id number] DATE OF BIRTH:  [birth year]  DATE OF PROCEDURE:  07/04/2011  PREOPERATIVE DIAGNOSIS: Right intertrochanteric femur fracture.   POSTOPERATIVE DIAGNOSIS: Right intertrochanteric femur fracture.   PROCEDURE PERFORMED: Open reduction internal fixation of right intertrochanteric femur fracture.   SURGEON: Illene LabradorJames P. Angie FavaHooten, Jr., MD   ANESTHESIA: Spinal.   ESTIMATED BLOOD LOSS: 300 mL.   FLUIDS REPLACED: 1200 mL of Crystalloid.   DRAINS: None.   IMPLANTS UTILIZED: Synthes 11 mm x 420 mm 130 degree trochanteric fixation nail (TFN), 95 mm helical blade, and a 5.0 mm locking screw.   INDICATIONS FOR SURGERY: The patient is an 81 year old male who [facility] Emergency Room via EMS following a fall at home. Exact details are not readily available. However, the patient was noted to have displaced right intertrochanteric femur fracture. He had sustained a similar injury to the contralateral hip approximately one year ago. The patient was noted to be bradycardic in the Emergency Room and upon admission. He was subsequently monitored on telemetry and beta-blocker had been discontinued with correction of his bradycardia. He was deemed to be cleared by both Internal Medicine and Cardiology for surgery. Risks and benefits of surgical intervention were discussed with the patient. He expressed his understanding of the risks and benefits and agreed with plans for surgical intervention.   PROCEDURE IN DETAIL: The patient was brought in the operating room and, after adequate spinal anesthesia was achieved, the patient was placed on the fracture table. All bony prominences were well padded. Traction was applied to the right lower extremity and provisional reduction of the intertrochanteric femur fracture was performed. The fracture margin tended to extend just below the lesser trochanter and lesser trochanter had fragmented. It was elected to proceed with a long  trochanteric fixation nail. The patient's right hip and leg were cleaned and prepped with alcohol and DuraPrep and draped in the usual sterile fashion. A "time-out" was performed as per usual protocol. A lateral longitudinal incision was made extending from the tip of the trochanter proximally. Fascia was incised in line with the skin incision and the fibers of the gluteal musculature were split in line. Tip of the greater trochanter was palpated and a distally threaded guidepin was inserted into the femoral canal. Position was confirmed in both AP and lateral planes using the C-arm. Step drill was used to enlarge the entry site. The short guidepin was exchanged with a long beaded reaming guidewire. Position was confirmed in both AP and lateral planes using the C-arm. The femoral canal was reamed in a sequential fashion up to 13 mm diameter. Measurements were obtained and it was felt that a 420 mm nail was the appropriate length. An 11 mm x 420 mm 130 degree trochanteric fixation nail was then advanced over the guidewire with position confirmed using the C-arm. Guidewire was removed. Second stab incision was made along the lateral aspect of the thigh and tissue protector was inserted through the outrigger device to the lateral cortex of the femur. A distally threaded guidepin was inserted into the femoral neck and head with position confirmed in both AP and lateral planes. Although initial measurements suggested a 105 mm helical blade, subsequent measurements confirmed 95 mm length for helical blade. The cannulated reamer was then advanced over the guidepin and 95 mm helical blade was advanced over the guidewire. The proximal locking mechanism for the helical blade was engaged. Good position was noted in both AP and lateral planes using  the C-arm. Next, C-arm was positioned so as to visualize the distal locking holes. Stab incision was made and a 5 mm distal locking screw was inserted through the long locking hole  and good position was noted in both AP and lateral planes.   Outrigger device was removed. Wounds were irrigated with copious amounts of normal saline with antibiotic solution using pulsatile lavage and then suctioned dry. Good hemostasis was noted. Fascia was closed using interrupted sutures of #1 Vicryl. Subcutaneous tissue was approximated in layers using first #0 Vicryl followed by #2-0 Vicryl. Skin was closed with skin staples. Sterile dressing was applied.   The patient tolerated the procedure well. He was transported to the recovery room in stable condition.    ____________________________ Illene Labrador. Angie Fava., MD jph:drc D: 07/04/2011 22:19:04 ET T: 07/05/2011 10:03:33 ET JOB#: [id number]  cc: Fayrene Fearing P. Angie Fava., MD, <Dictator> JAMES P Angie Fava MD ELECTRONICALLY SIGNED 07/05/2011 20:31
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
## 2014-10-03 NOTE — Consult Note (Signed)
Pt lacks capacity.stat dictation.  Electronic Signatures: Lester CarolinaWilliford, Shaneese Tait S (MD)  (Signed on 22-Jan-13 13:27)  Authored  Last Updated: 22-Jan-13 13:27 by Lester CarolinaWilliford, Avyn Coate S (MD)

## 2014-10-03 NOTE — Consult Note (Signed)
PATIENT NAME:  Carl Richards, Carl Richards MR#:  [id number] DATE OF BIRTH:  1925/07/21  DATE OF CONSULTATION:  07/03/2011  REFERRING PHYSICIAN:  Dorothea GlassmanPaul Malinda, MD CONSULTING PHYSICIAN:  Margherita Collyer, MD  REASON FOR CONSULTATION: Junctional bradycardia.   HISTORY OF PRESENT ILLNESS: Carl Richards is an 81 year old gentleman with history of seizure disorder, atrial fibrillation, iron deficiency <<MISSING TEXT>> (dictation stopped) ____________________________ Reuel DerbyAlounthith Bernedette Auston, MD ap:slb D: 07/03/2011 10:23:22 ET (Entered as incorrect work type - 02) T: 07/03/2011 10:51:20 ET JOB#: [id number]  cc: Lashante Fryberger, MD, <Dictator>

## 2014-10-03 NOTE — Consult Note (Signed)
Chief Complaint:   Subjective/Chief Complaint Feeling well no cp no asyncope. No f/c/s.   VITAL SIGNS/ANCILLARY NOTES: **Vital Signs.:   26-Jan-13 09:26   Vital Signs Type Q 4hr   Temperature Temperature (F) 99.4   Celsius 37.4   Temperature Source oral   Pulse Pulse 94   Pulse source per Dinamap   Respirations Respirations 20   Systolic BP Systolic BP 120   Diastolic BP (mmHg) Diastolic BP (mmHg) 53   Mean BP 75   BP Source Dinamap   Pulse Ox % Pulse Ox % 93   Pulse Ox Activity Level  At rest   Oxygen Delivery Room Air/ 21 %  *Intake and Output.:   Daily 26-Jan-13 07:00   Grand Totals Intake:   Output:  600    Net:  -600 24 Hr.:  -600   Urine ml     Out:  600   Length of Stay Totals Intake:  3538 Output:  4075    Net:  -537   Brief Assessment:   Cardiac Regular  murmur present  -- LE edema  -- JVD  --Gallop    Respiratory normal resp effort  clear BS    Gastrointestinal Normal    Gastrointestinal details normal Soft  Nontender  Nondistended  No masses palpable   Routine Hem:  26-Jan-13 05:25    Hemoglobin (CBC) 8.3   Radiology Results: XRay:    22-Jan-13 05:13, Chest Portable Single View   Chest Portable Single View    REASON FOR EXAM:    fell, ht rate 33  COMMENTS:       PROCEDURE: DXR - DXR PORTABLE CHEST SINGLE VIEW  - Jul 03 2011  5:13AM     RESULT: Comparison is made to the study of 06 June 2010.    The lungs are adequately inflated. There is new blunting of the left   lateral costophrenic angle. The pulmonary interstitial markings are   increased bilaterally. The pulmonary vascularity is engorged. The cardiac   silhouette has increased in size. The mediastinum is normal in width.   There are degenerative changes of the left shoulder. I see no acute   displaced rib fracture. There is evidence of previous granulomatous   infection of the lungs.    IMPRESSION:  The findings are consistent with CHF superimposed upon COPD.   I do not see  evidence of a pneumothorax nor pneumomediastinum. If there   remain strong clinical concerns of occult occult thoracic posttraumatic   injury, further evaluation with CT scanning is available upon request.          Verified By: DAVID A. SwazilandJORDAN, M.D., MD    22-Jan-13 05:13, Hip Right Complete   Hip Right Complete    REASON FOR EXAM:    fell pain  COMMENTS:       PROCEDURE: DXR - DXR HIP RIGHT COMPLETE  - Jul 03 2011  5:13AM     RESULT: AP and crosstable lateral views of the right hip reveal the   patient to have sustained an intertrochanteric fracture. There is   angulation at the fracture site. There is a avulsion of the lesser   trochanter. There is mild asymmetric narrowing of the hip joint space   superiorly.    IMPRESSION:  The patient has sustained an acute intertrochanteric   fracture of the right hip.        Verified By: DAVID A. [place]azil[place]JORDAN, M.D., MD    22-Jan-13 05:13, Pelvis AP Only  Pelvis AP Only    REASON FOR EXAM:    fell hip pain  COMMENTS:       PROCEDURE: DXR - DXR PELVIS AP ONLY  - Jul 03 2011  5:13AM     RESULT: The bony pelvis is osteopenic. There is an acute   intertrochanteric fracture of the right hip. There is previous ORIF for   an intertrochanteric fracture of the left hip. I see no acute fracture of   the bony pelvis. There is mild degenerative change of the right hip joint.    IMPRESSION:  I do not see evidence of an acute fracture of the bony   pelvis in this patient with anacute right intertrochanteric fracture.          Verified By: DAVID A. [place], M.D., MD    23-Jan-13 21:27, Femur Right   Femur Right    REASON FOR EXAM:    postop  COMMENTS:       PROCEDURE: DXR - DXR FEMUR RIGHT  - Jul 04 2011  9:27PM     RESULT: Findings: AP and lateral views of the right femur demonstrates   interval placement of an intramedullary nail transfixing a   intertrochanteric fracture a which is in near anatomic alignment. The   intramedullary nail is  transfixed with a proximal cannulated femoral neck   screw and a distal transverse fully threaded screw without hardware   failure or complication. There are moderate degenerative changes of the   right hip. There are surrounding postsurgical changes in the soft   tissues. There is no dislocation. There is no hardware failure or   complication.    IMPRESSION:     Please see above.          Verified By: Joellyn Haff, M.D., MD  Cardiology:    22-Jan-13 03:16, ED ECG   Ventricular Rate 33   Atrial Rate 234   QRS Duration 106   QT 592   QTc 438   R Axis -57   T Axis 46   ECG interpretation    Junctional bradycardia  Left axis deviation  Inferior infarct , age undetermined  Abnormal ECG  When compared with ECG of 07-Jun-2010 02:43,  Junctional rhythm has replaced Sinus rhythm  Vent. rate has decreased BY  46 BPM  QT has shortened  ----------unconfirmed----------  Confirmed by OVERREAD, NOT (100), editor PEARSON, BARBARA ([id number]) on 07/03/2011 10:44:06 AM   ED ECG     22-Jan-13 06:41, ECG   Ventricular Rate 66   Atrial Rate 60   P-R Interval 272   QRS Duration 102   QT 482   QTc 505   P Axis -48   R Axis -51   T Axis 35   ECG interpretation    Accelerated Junctional rhythm with occasional Premature ventricular complexes  Left axis deviation  Septal infarct , age undetermined  Prolonged QT  Abnormal ECG  When compared with ECG of 03-Jul-2011 03:16,  Premature ventricular complexes are now Present  Vent. rate has increased BY  33 BPM  T wave inversion now evident in Inferior leads  QT has lengthened  ----------unconfirmed----------  Confirmed by OVERREAD, NOT (100), editor PEARSON, BARBARA ([id number]) on 07/04/2011 9:38:08 AM   ECG   CT:    22-Jan-13 03:46, CT Head Without Contrast   CT Head Without Contrast    REASON FOR EXAM:    fell hit head  COMMENTS:       PROCEDURE: CT  -  CT HEAD WITHOUT CONTRAST  - Jul 03 2011  3:46AM     RESULT: Axial noncontrast CT scanning  was performed through the brain   with reconstructions at 5 mm intervals and slice thicknesses. Comparison   is made to the study of 07 June 2010.    There is mild age-appropriate diffuse cerebral and cerebellar atrophy   with very mild compensatory ventriculomegaly. There is decreased density   in the deep white matter of both cerebral hemispheres consistent with   chronic small vessel ischemic type change. Old subcentimeter lacunar   infarctions are demonstrated in the basal ganglia bilaterally. There is   no intracranial hemorrhage nor evidence of an acute evolving ischemic     infarction. I see no abnormal intracranial calcifications. The cerebellum   and brainstem exhibit no acute abnormality.    At bone window settings the observed portions of the paranasal sinuses   and left mastoid air cells are clear. There is chronic increased density   within the right mastoid bone. There is no evidence of an acute skull   fracture.    IMPRESSION:  I see no acute intracranial abnormality.    A preliminary report was sent to the emergency department at the   conclusion of the study.          Verified By: DAVID A. [place], M.D., MD    22-Jan-13 04:02, CT Cervical Spine Without Contrast   CT Cervical Spine Without Contrast    REASON FOR EXAM:    fell hit head  COMMENTS:       PROCEDURE: CT  - CT CERVICAL SPINE WO  - Jul 03 2011  4:02AM     RESULT:     HISTORY: Fall.    COMPARISON: No prior.    PROCEDURE AND FINDINGS:  Standard CT of the cervical spine is obtained.   No evidence of fracture. Diffuse degenerative change is present.   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 Vascular calcification is present.   Small submandibular and anterior cervical lymph nodes are noted. Apical     pleural parenchymal thickening is present.     IMPRESSION:      1.  Diffuse degenerative change, no acute abnormality.  2.  Multiple submandibular and anterior cervical small lymph nodes.  3.   Pleural parenchymal scarring lung apices.  4.  Bilateral carotid prominent vascular calcification indicating   atherosclerotic vascular disease.      Thank you for this opportunity to contribute to the care of your patient.           Verified By: Gwynn Burly, M.D., MD   Assessment/Plan:  Invasive Device Daily Assessment of Necessity:   Does the patient currently have any of the following indwelling devices? none   Assessment/Plan:   Assessment IMP POD x 3 Bradycardia improved SSS AFIB HTN AMS Hip Fx    Plan PLAN May consider amnoidarone long term  S/P hip surgery Continue to hold sotolol consider low dose B-blocker for rate Not a good coumadin candiate because of falls [facility] i do not rec further cardiac w/u at this point F/U cardiology as outpt   Electronic Signatures: Dorothyann Peng D (MD)  (Signed 26-Jan-13 11:36)  Authored: Chief Complaint, VITAL SIGNS/ANCILLARY NOTES, Brief Assessment, Lab Results, Radiology Results, Assessment/Plan   Last Updated: 26-Jan-13 11:36 by Dorothyann Peng D (MD)
